# Patient Record
Sex: Male | Born: 1935 | Race: White | Hispanic: No | Marital: Single | Smoking: Never smoker
Health system: Southern US, Community
[De-identification: ages and names within clinical notes are randomized; demographics above are authoritative.]

## PROBLEM LIST (undated history)

## (undated) DIAGNOSIS — E7800 Pure hypercholesterolemia, unspecified: Secondary | ICD-10-CM

## (undated) DIAGNOSIS — F0390 Unspecified dementia without behavioral disturbance: Secondary | ICD-10-CM

## (undated) DIAGNOSIS — K219 Gastro-esophageal reflux disease without esophagitis: Secondary | ICD-10-CM

## (undated) DIAGNOSIS — I10 Essential (primary) hypertension: Secondary | ICD-10-CM

---

## 1997-12-27 ENCOUNTER — Other Ambulatory Visit: Admission: RE | Admit: 1997-12-27 | Discharge: 1997-12-27 | Payer: Self-pay | Admitting: Internal Medicine

## 1998-01-04 ENCOUNTER — Encounter: Payer: Self-pay | Admitting: Internal Medicine

## 1998-01-04 ENCOUNTER — Ambulatory Visit (HOSPITAL_COMMUNITY): Admission: RE | Admit: 1998-01-04 | Discharge: 1998-01-04 | Payer: Self-pay | Admitting: Internal Medicine

## 2004-02-15 ENCOUNTER — Ambulatory Visit: Payer: Self-pay | Admitting: Internal Medicine

## 2004-02-29 ENCOUNTER — Ambulatory Visit: Payer: Self-pay | Admitting: Internal Medicine

## 2009-02-22 ENCOUNTER — Telehealth (INDEPENDENT_AMBULATORY_CARE_PROVIDER_SITE_OTHER): Payer: Self-pay | Admitting: *Deleted

## 2009-03-08 ENCOUNTER — Encounter (INDEPENDENT_AMBULATORY_CARE_PROVIDER_SITE_OTHER): Payer: Self-pay | Admitting: *Deleted

## 2009-03-15 ENCOUNTER — Telehealth: Payer: Self-pay | Admitting: Internal Medicine

## 2017-07-11 ENCOUNTER — Other Ambulatory Visit: Payer: Self-pay

## 2017-07-11 ENCOUNTER — Encounter (HOSPITAL_BASED_OUTPATIENT_CLINIC_OR_DEPARTMENT_OTHER): Payer: Self-pay | Admitting: Emergency Medicine

## 2017-07-11 ENCOUNTER — Emergency Department (HOSPITAL_BASED_OUTPATIENT_CLINIC_OR_DEPARTMENT_OTHER): Payer: Medicare Other

## 2017-07-11 ENCOUNTER — Emergency Department (HOSPITAL_BASED_OUTPATIENT_CLINIC_OR_DEPARTMENT_OTHER)
Admission: EM | Admit: 2017-07-11 | Discharge: 2017-07-11 | Disposition: A | Discharge status: Home / Self Care | Payer: Medicare Other | Attending: Emergency Medicine | Admitting: Emergency Medicine

## 2017-07-11 DIAGNOSIS — Z7982 Long term (current) use of aspirin: Secondary | ICD-10-CM | POA: Diagnosis not present

## 2017-07-11 DIAGNOSIS — K5792 Diverticulitis of intestine, part unspecified, without perforation or abscess without bleeding: Secondary | ICD-10-CM

## 2017-07-11 DIAGNOSIS — Z79899 Other long term (current) drug therapy: Secondary | ICD-10-CM | POA: Diagnosis not present

## 2017-07-11 DIAGNOSIS — Y93E1 Activity, personal bathing and showering: Secondary | ICD-10-CM | POA: Diagnosis not present

## 2017-07-11 DIAGNOSIS — S299XXA Unspecified injury of thorax, initial encounter: Secondary | ICD-10-CM | POA: Diagnosis present

## 2017-07-11 DIAGNOSIS — F0390 Unspecified dementia without behavioral disturbance: Secondary | ICD-10-CM | POA: Insufficient documentation

## 2017-07-11 DIAGNOSIS — Y999 Unspecified external cause status: Secondary | ICD-10-CM | POA: Insufficient documentation

## 2017-07-11 DIAGNOSIS — I10 Essential (primary) hypertension: Secondary | ICD-10-CM | POA: Insufficient documentation

## 2017-07-11 DIAGNOSIS — S2232XA Fracture of one rib, left side, initial encounter for closed fracture: Secondary | ICD-10-CM | POA: Diagnosis not present

## 2017-07-11 DIAGNOSIS — W010XXA Fall on same level from slipping, tripping and stumbling without subsequent striking against object, initial encounter: Secondary | ICD-10-CM | POA: Diagnosis not present

## 2017-07-11 DIAGNOSIS — R778 Other specified abnormalities of plasma proteins: Secondary | ICD-10-CM

## 2017-07-11 DIAGNOSIS — Y92121 Bathroom in nursing home as the place of occurrence of the external cause: Secondary | ICD-10-CM | POA: Diagnosis not present

## 2017-07-11 DIAGNOSIS — R7989 Other specified abnormal findings of blood chemistry: Secondary | ICD-10-CM

## 2017-07-11 HISTORY — DX: Essential (primary) hypertension: I10

## 2017-07-11 HISTORY — DX: Gastro-esophageal reflux disease without esophagitis: K21.9

## 2017-07-11 HISTORY — DX: Pure hypercholesterolemia, unspecified: E78.00

## 2017-07-11 HISTORY — DX: Unspecified dementia, unspecified severity, without behavioral disturbance, psychotic disturbance, mood disturbance, and anxiety: F03.90

## 2017-07-11 LAB — COMPREHENSIVE METABOLIC PANEL
ALT: 16 U/L — AB (ref 17–63)
AST: 19 U/L (ref 15–41)
Albumin: 3.6 g/dL (ref 3.5–5.0)
Alkaline Phosphatase: 56 U/L (ref 38–126)
Anion gap: 9 (ref 5–15)
BUN: 12 mg/dL (ref 6–20)
CHLORIDE: 99 mmol/L — AB (ref 101–111)
CO2: 24 mmol/L (ref 22–32)
Calcium: 9.7 mg/dL (ref 8.9–10.3)
Creatinine, Ser: 0.96 mg/dL (ref 0.61–1.24)
Glucose, Bld: 136 mg/dL — ABNORMAL HIGH (ref 65–99)
POTASSIUM: 4.3 mmol/L (ref 3.5–5.1)
SODIUM: 132 mmol/L — AB (ref 135–145)
Total Bilirubin: 1 mg/dL (ref 0.3–1.2)
Total Protein: 7 g/dL (ref 6.5–8.1)

## 2017-07-11 LAB — CBC WITH DIFFERENTIAL/PLATELET
Basophils Absolute: 0 10*3/uL (ref 0.0–0.1)
Basophils Relative: 1 %
EOS PCT: 1 %
Eosinophils Absolute: 0.1 10*3/uL (ref 0.0–0.7)
HCT: 38.9 % — ABNORMAL LOW (ref 39.0–52.0)
Hemoglobin: 13.3 g/dL (ref 13.0–17.0)
LYMPHS ABS: 1 10*3/uL (ref 0.7–4.0)
LYMPHS PCT: 12 %
MCH: 31.8 pg (ref 26.0–34.0)
MCHC: 34.2 g/dL (ref 30.0–36.0)
MCV: 93.1 fL (ref 78.0–100.0)
Monocytes Absolute: 0.8 10*3/uL (ref 0.1–1.0)
Monocytes Relative: 9 %
Neutro Abs: 6.8 10*3/uL (ref 1.7–7.7)
Neutrophils Relative %: 77 %
PLATELETS: 224 10*3/uL (ref 150–400)
RBC: 4.18 MIL/uL — AB (ref 4.22–5.81)
RDW: 12.6 % (ref 11.5–15.5)
WBC: 8.8 10*3/uL (ref 4.0–10.5)

## 2017-07-11 LAB — URINALYSIS, ROUTINE W REFLEX MICROSCOPIC
Bilirubin Urine: NEGATIVE
GLUCOSE, UA: NEGATIVE mg/dL
Hgb urine dipstick: NEGATIVE
KETONES UR: 15 mg/dL — AB
LEUKOCYTES UA: NEGATIVE
Nitrite: NEGATIVE
Protein, ur: NEGATIVE mg/dL
Specific Gravity, Urine: <1.005 — ABNORMAL LOW (ref 1.005–1.030)
pH: 7 (ref 5.0–8.0)

## 2017-07-11 LAB — TROPONIN I
TROPONIN I: 0.05 ng/mL — AB (ref <0.03)
TROPONIN I: 0.05 ng/mL — AB (ref <0.03)

## 2017-07-11 MED ORDER — HYDRALAZINE HCL 20 MG/ML IJ SOLN
5.0000 mg | Freq: Once | INTRAMUSCULAR | Status: AC
  Administered 2017-07-11: 5 mg via INTRAVENOUS
  Filled 2017-07-11: qty 1

## 2017-07-11 MED ORDER — AMOXICILLIN-POT CLAVULANATE 875-125 MG PO TABS: 1.0000 | ORAL_TABLET | Freq: Two times a day (BID) | ORAL | 0 refills | Status: AC

## 2017-07-11 MED ORDER — IOPAMIDOL (ISOVUE-300) INJECTION 61%: 100.0000 mL | Freq: Once | INTRAVENOUS | Status: DC | PRN

## 2017-07-11 MED ORDER — IOPAMIDOL (ISOVUE-370) INJECTION 76%
100.0000 mL | Freq: Once | INTRAVENOUS | Status: AC | PRN
  Administered 2017-07-11: 100 mL via INTRAVENOUS

## 2017-07-11 MED ORDER — TRAMADOL HCL 50 MG PO TABS: 50.0000 mg | ORAL_TABLET | Freq: Four times a day (QID) | ORAL | 0 refills | Status: AC | PRN

## 2017-07-11 MED ORDER — ACETAMINOPHEN 325 MG PO TABS
650.0000 mg | ORAL_TABLET | Freq: Once | ORAL | Status: AC
  Administered 2017-07-11: 650 mg via ORAL
  Filled 2017-07-11: qty 2

## 2017-07-11 NOTE — ED Notes (Signed)
Patient transported to CT 

## 2017-07-11 NOTE — ED Notes (Signed)
Edema noted to L elbow, MD and RN made aware.

## 2017-07-11 NOTE — ED Provider Notes (Signed)
[facility] EMERGENCY DEPARTMENT Provider Note   CSN: [id number] Arrival date & time: 07/11/17  1252     History   Chief Complaint Chief Complaint  Patient presents with  . Fall    HPI James Mcfarland is a 84 y.o. male hx of dementia, GERD, HTN, here with fall. Patient is from nursing home. He was in the shower and then fell and landed on his left side. Complains of L rib pain. Patient is demented and didn't remember how he fell but denies chest pain or dizziness prior to the fall. Patient unclear if he hit his head or not. Patient is not on blood thinners. EMS called and brought him for evaluation.   The history is provided by the patient and a relative.   Level V caveat- dementia   Past Medical History:  Diagnosis Date  . Dementia   . GERD (gastroesophageal reflux disease)   . Hypercholesteremia   . Hypertension     There are no active problems to display for this patient.   History reviewed. No pertinent surgical history.      Home Medications    Prior to Admission medications   Medication Sig Start Date End Date Taking? Authorizing Provider  aspirin EC 81 MG tablet Take 81 mg by mouth daily.   Yes [provider]  donepezil (ARICEPT) 10 MG tablet Take 10 mg by mouth at bedtime.   Yes [provider]  furosemide (LASIX) 20 MG tablet Take 20 mg by mouth.   Yes [provider]  losartan (COZAAR) 25 MG tablet Take 25 mg by mouth daily.   Yes [provider]  lovastatin (MEVACOR) 40 MG tablet Take 40 mg by mouth at bedtime.   Yes [provider]  metoprolol succinate (TOPROL-XL) 50 MG 24 hr tablet Take 50 mg by mouth daily. Take with or immediately following a meal.   Yes [provider]  nitroGLYCERIN (NITROSTAT) 0.4 MG SL tablet Place 0.4 mg under the tongue every 5 (five) minutes as needed for chest pain.   Yes [provider]  omeprazole (PRILOSEC) 40 MG capsule Take 40 mg by mouth daily.    Yes [provider]  risperiDONE (RISPERDAL) 0.5 MG tablet Take 0.5 mg by mouth at bedtime.   Yes [provider]  sertraline (ZOLOFT) 25 MG tablet Take 25 mg by mouth daily.   Yes [provider]  tamsulosin (FLOMAX) 0.4 MG CAPS capsule Take 0.4 mg by mouth.   Yes [provider]    Family History History reviewed. No pertinent family history.  Social History Social History   Tobacco Use  . Smoking status: Never Smoker  . Smokeless tobacco: Never Used  Substance Use Topics  . Alcohol use: Never    Frequency: Never  . Drug use: Never     Allergies   Septra [sulfamethoxazole-trimethoprim]   Review of Systems Review of Systems  Musculoskeletal:       L rib pain   All other systems reviewed and are negative.    Physical Exam Updated Vital Signs BP (!) 168/78   Pulse 62   Temp 98 F (36.7 C) (Oral)   Resp 13   Ht  (1.778 m)   Wt 77.1 kg (170 lb)   SpO2 100%   BMI 24.39 kg/m   Physical Exam  Constitutional:  Chronically ill, demented   HENT:  Head: Normocephalic.  No obvious scalp hematoma   Eyes: Pupils are equal, round, and  reactive to light. Conjunctivae and EOM are normal.  Neck: Normal range of motion. Neck supple.  Cardiovascular: Normal rate, regular rhythm and normal heart sounds.  Pulmonary/Chest: Effort normal and breath sounds normal.  Tenderness L lower ribs, mild bruising, no obvious ecchymosis   Abdominal: Soft. Bowel sounds are normal. He exhibits no distension. There is no tenderness. There is no guarding.  Musculoskeletal: Normal range of motion.  No midline tenderness, mild swelling l elbow, nl ROM. Nl ROM bilateral hips   Neurological: He is alert. No cranial nerve deficit.  Demented, moving all extremities   Skin: Skin is warm.  Psychiatric: He has a normal mood and affect.  Nursing note and vitals reviewed.    ED Treatments / Results  Labs (all labs ordered are listed, but only abnormal  results are displayed) Labs Reviewed  CBC WITH DIFFERENTIAL/PLATELET - Abnormal; Notable for the following components:      Result Value   RBC 4.18 (*)    HCT 38.9 (*)    All other components within normal limits  COMPREHENSIVE METABOLIC PANEL - Abnormal; Notable for the following components:   Sodium 132 (*)    Chloride 99 (*)    Glucose, Bld 136 (*)    ALT 16 (*)    All other components within normal limits  TROPONIN I - Abnormal; Notable for the following components:   Troponin I 0.05 (*)    All other components within normal limits  TROPONIN I    EKG EKG Interpretation  Date/Time:  Saturday July 11 2017 14:02:49 EDT Ventricular Rate:  52 PR Interval:    QRS Duration: 96 QT Interval:  472 QTC Calculation: 439 R Axis:   92 Text Interpretation:  Sinus rhythm Right axis deviation No previous ECGs available Confirmed by Richardean Canal ([id number]) on 07/11/2017 2:14:27 PM   Radiology Dg Ribs Unilateral W/chest Left  Result Date: 07/11/2017 CLINICAL DATA:  Fall in shower this morning. Left rib pain. Initial encounter. EXAM: LEFT RIBS AND CHEST - 3+ VIEW COMPARISON:  09/06/2015 FINDINGS: Mildly displaced posterior left eighth rib fracture. Neighboring hazy density on the chest x-rays attributed to soft tissue attenuation given the dedicated rib views. There is no visible hemothorax or pneumothorax. Normal heart size. Aortic tortuosity. Degenerative disc disease. IMPRESSION: 1. Mildly displaced posterior left eighth rib fracture. 2. Negative for pneumothorax or hemothorax. Electronically Signed   By: Marnee Spring M.D.   On: 07/11/2017 14:15   Dg Elbow Complete Left  Result Date: 07/11/2017 CLINICAL DATA:  Fall in shower with swelling at the posterior left elbow. Initial encounter. EXAM: LEFT ELBOW - COMPLETE 3+ VIEW COMPARISON:  None. FINDINGS: Swelling at the olecranon bursa. No fracture or joint effusion. Small calcifications along the ventral lower humerus are smooth and chronic  appearing. Osteopenia. Medial epicondylar enthesophytes. Chondrocalcinosis about the radial head. IMPRESSION: 1. Soft tissue swelling at the olecranon process, possibly in the bursa. 2. No acute osseous finding. Electronically Signed   By: Marnee Spring M.D.   On: 07/11/2017 15:17   Ct Head Wo Contrast  Result Date: 07/11/2017 CLINICAL DATA:  Status post fall.  Pain. EXAM: CT HEAD WITHOUT CONTRAST CT CERVICAL SPINE WITHOUT CONTRAST TECHNIQUE: Multidetector CT imaging of the head and cervical spine was performed following the standard protocol without intravenous contrast. Multiplanar CT image reconstructions of the cervical spine were also generated. COMPARISON:  September 05, 2015 FINDINGS: CT HEAD FINDINGS Brain: No subdural, epidural, or subarachnoid hemorrhage. Cerebellum, brainstem, and basal cisterns are normal.  Ventricles and sulci are prominent but stable. No mass effect or midline shift. No acute cortical ischemia or infarct. Vascular: Calcified atherosclerosis in the intracranial carotids. Skull: Normal. Negative for fracture or focal lesion. Sinuses/Orbits: No acute finding. Other: None. CT CERVICAL SPINE FINDINGS Alignment: Scoliotic curvature, apex to the left. No other malalignment. No acute fractures. Skull base and vertebrae: No acute fracture. No primary bone lesion or focal pathologic process. Soft tissues and spinal canal: No prevertebral fluid or swelling. No visible canal hematoma. Disc levels: Severe multilevel degenerative changes, markedly increased since June 2017. Upper chest: Negative. Other: No other abnormalities identified. IMPRESSION: 1. No acute intracranial abnormalities identified. 2. Scoliotic curvature of the cervical spine. No traumatic malalignment. Severe multilevel degenerative changes, markedly worsened since 2017. No fractures are noted. Electronically Signed   By: Gerome Sam III M.D   On: 07/11/2017 14:27   Ct Cervical Spine Wo Contrast  Result Date:  07/11/2017 CLINICAL DATA:  Status post fall.  Pain. EXAM: CT HEAD WITHOUT CONTRAST CT CERVICAL SPINE WITHOUT CONTRAST TECHNIQUE: Multidetector CT imaging of the head and cervical spine was performed following the standard protocol without intravenous contrast. Multiplanar CT image reconstructions of the cervical spine were also generated. COMPARISON:  September 05, 2015 FINDINGS: CT HEAD FINDINGS Brain: No subdural, epidural, or subarachnoid hemorrhage. Cerebellum, brainstem, and basal cisterns are normal. Ventricles and sulci are prominent but stable. No mass effect or midline shift. No acute cortical ischemia or infarct. Vascular: Calcified atherosclerosis in the intracranial carotids. Skull: Normal. Negative for fracture or focal lesion. Sinuses/Orbits: No acute finding. Other: None. CT CERVICAL SPINE FINDINGS Alignment: Scoliotic curvature, apex to the left. No other malalignment. No acute fractures. Skull base and vertebrae: No acute fracture. No primary bone lesion or focal pathologic process. Soft tissues and spinal canal: No prevertebral fluid or swelling. No visible canal hematoma. Disc levels: Severe multilevel degenerative changes, markedly increased since June 2017. Upper chest: Negative. Other: No other abnormalities identified. IMPRESSION: 1. No acute intracranial abnormalities identified. 2. Scoliotic curvature of the cervical spine. No traumatic malalignment. Severe multilevel degenerative changes, markedly worsened since 2017. No fractures are noted. Electronically Signed   By: Gerome Sam III M.D   On: 07/11/2017 14:27    Procedures Procedures (including critical care time)  Medications Ordered in ED Medications  acetaminophen (TYLENOL) tablet 650 mg (650 mg Oral Given 07/11/17 1347)  hydrALAZINE (APRESOLINE) injection 5 mg (5 mg Intravenous Given 07/11/17 1539)     Initial Impression / Assessment and Plan / ED Course  I have reviewed the triage vital signs and the nursing  notes.  Pertinent labs & imaging results that were available during my care of the patient were reviewed by me and considered in my medical decision making (see chart for details).     James Mcfarland is a 84 y.o. male here with fall. Unclear if he syncopized or not as he is demented and can't tell me exactly how he fell in the shower. He is not on blood thinners. Has L rib pain so will get rib series. Will get CT head/neck given possible head injury. Will get labs and EKG as well.   3:57 PM Labs showed Trop 0.05, no baseline. No obvious AKI. No chest pain currently. Family just told me that he has recent diagnosis of thoracic aneurysm. I reviewed previous chart and he had incidental thoracic aneurysm about 4 cm but imaging is not available on Epic. He is hypertensive so will get CTA to r/o  dissection or rupture. I discussed with family in detail about plan of care. He is DNR. He doesn't want cath or aggressive measures. They are ok with CTA to r/o dissection and second troponin. They prefer that he not be admitted to the hospital if possible. Care signed out to Dr. Jacqulyn Bath in the ED to follow up CTA and delta trop.   Final Clinical Impressions(s) / ED Diagnoses   Final diagnoses:  None    ED Discharge Orders    None       Charlynne Pander, MD 07/11/17 1558

## 2017-07-11 NOTE — Discharge Instructions (Addendum)
Take tylenol for pain.   Take tramadol for severe. You have rib fracture and are expected to be in pain   Use incentive spirometer four times daily to expand your lungs and prevent pneumonia.   Your heart enzymes are slightly elevated. See cardiology for follow up   Your CT scan shows some Diverticulitis. We are starting antibiotics.   Follow up with your primary care doctor next week   Return to ER if you have worse chest pain, trouble breathing, abdominal pain, weakness

## 2017-07-11 NOTE — ED Provider Notes (Signed)
Blood pressure (!) 168/78, pulse 62, temperature 98 F (36.7 C), temperature source Oral, resp. rate 13, height  (1.778 m), weight 77.1 kg (170 lb), SpO2 100 %.  Assuming care from Dr. Silverio Lay.  In short, James Mcfarland is a 84 y.o. male with a chief complaint of Fall .  Refer to the original H&P for additional details.  The current plan of care is to f/u CT dissection scan and repeat troponin. Patient is DNR and pain is well controlled at this time.    EKG Interpretation  Date/Time:  Saturday July 11 2017 14:02:49 EDT Ventricular Rate:  52 PR Interval:    QRS Duration: 96 QT Interval:  472 QTC Calculation: 439 R Axis:   92 Text Interpretation:  Sinus rhythm Right axis deviation No previous ECGs available Confirmed by Richardean Canal [phone]) on 07/11/2017 2:14:27 PM      06:53 PM Patient's repeat troponin is unchanged.  CT scan reviewed with the patient and family at bedside.  No change in the thoracic aortic aneurysm.  Single rib fracture identified.  Also discussed the renal cyst and recommended PCP follow-up.  Patient to do incentive spirometry every hour while awake and take Tylenol as needed for pain.  Dr. Silverio Lay prescribed tramadol for breakthrough pain.  He can also describe some diverticulitis.  Patient does have a history of this and some pain on exam.  Plan to cover with Augmentin and follow-up with the PCP.   At this time, I do not feel there is any life-threatening condition present. I have reviewed and discussed all results (EKG, imaging, lab, urine as appropriate), exam findings with patient. I have reviewed nursing notes and appropriate previous records.  I feel the patient is safe to be discharged home without further emergent workup. Discussed usual and customary return precautions. Patient and family (if present) verbalize understanding and are comfortable with this plan.  Patient will follow-up with their primary care provider. If they do not have a primary care provider,  information for follow-up has been provided to them. All questions have been answered.  Alona Bene, MD Emergency Medicine     Long, Arlyss Repress, MD 07/11/17 [phone]

## 2017-07-11 NOTE — ED Triage Notes (Signed)
Per ems the patient fell earlier this am and hurt his left rib cage - the patient denies any LOC or hitting his head

## 2018-11-24 DIAGNOSIS — I2699 Other pulmonary embolism without acute cor pulmonale: Secondary | ICD-10-CM

## 2018-11-24 DIAGNOSIS — R739 Hyperglycemia, unspecified: Secondary | ICD-10-CM

## 2018-11-24 DIAGNOSIS — J690 Pneumonitis due to inhalation of food and vomit: Secondary | ICD-10-CM

## 2018-11-24 DIAGNOSIS — I10 Essential (primary) hypertension: Secondary | ICD-10-CM

## 2018-11-24 DIAGNOSIS — F0390 Unspecified dementia without behavioral disturbance: Secondary | ICD-10-CM

## 2018-11-25 DIAGNOSIS — R739 Hyperglycemia, unspecified: Secondary | ICD-10-CM | POA: Diagnosis not present

## 2018-11-25 DIAGNOSIS — J690 Pneumonitis due to inhalation of food and vomit: Secondary | ICD-10-CM | POA: Diagnosis not present

## 2018-11-25 DIAGNOSIS — I10 Essential (primary) hypertension: Secondary | ICD-10-CM | POA: Diagnosis not present

## 2018-11-25 DIAGNOSIS — I2699 Other pulmonary embolism without acute cor pulmonale: Secondary | ICD-10-CM | POA: Diagnosis not present

## 2018-11-26 DIAGNOSIS — J690 Pneumonitis due to inhalation of food and vomit: Secondary | ICD-10-CM | POA: Diagnosis not present

## 2018-11-26 DIAGNOSIS — I10 Essential (primary) hypertension: Secondary | ICD-10-CM | POA: Diagnosis not present

## 2018-11-26 DIAGNOSIS — I2699 Other pulmonary embolism without acute cor pulmonale: Secondary | ICD-10-CM | POA: Diagnosis not present

## 2018-11-26 DIAGNOSIS — R739 Hyperglycemia, unspecified: Secondary | ICD-10-CM | POA: Diagnosis not present

## 2019-01-16 DEATH — deceased

## 2019-09-14 IMAGING — DX DG ELBOW COMPLETE 3+V*L*
4 series · 4 of 4 positions shown · non-contrast
Comparison: None.

CLINICAL DATA: Fall in shower with swelling at the posterior left
elbow. Initial encounter.

EXAM:
LEFT ELBOW - COMPLETE 3+ VIEW

[elbow ap]
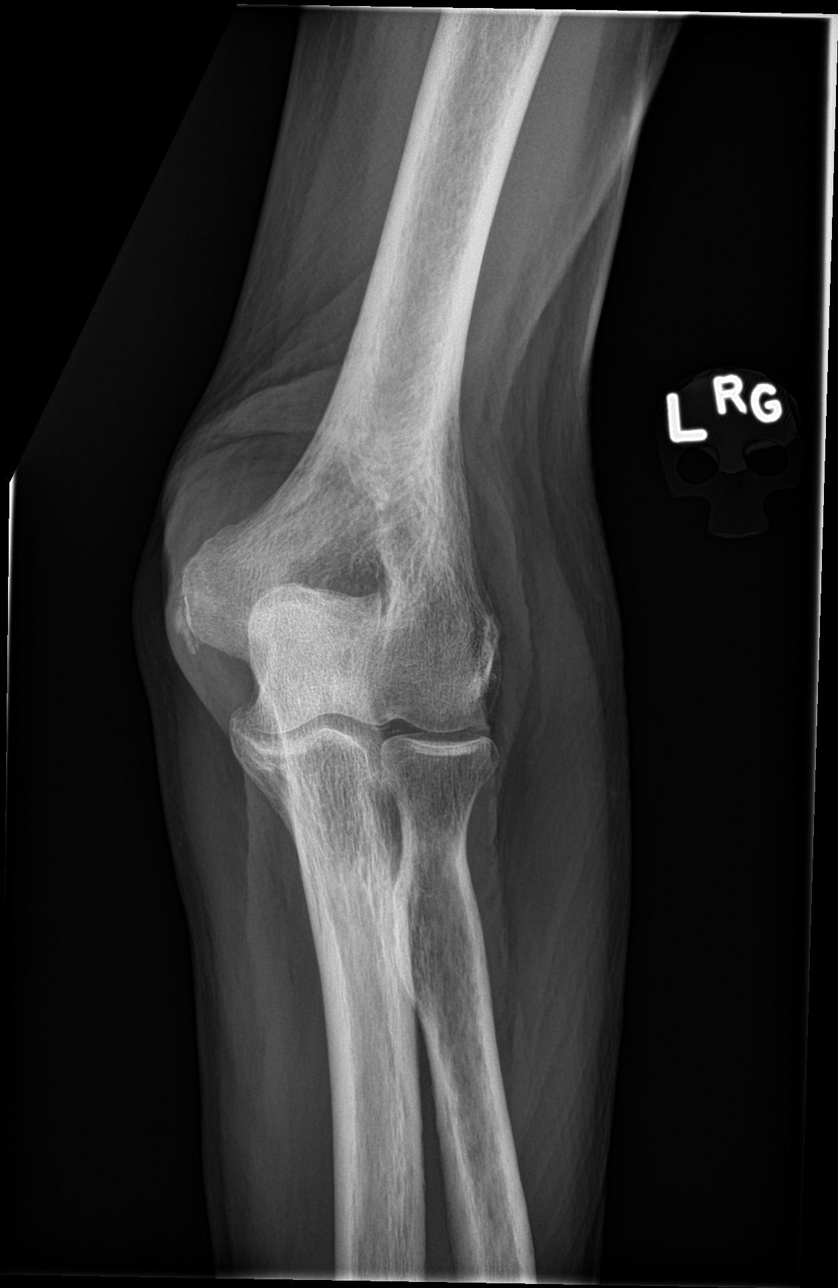

[elbow obl (1 of 2)]
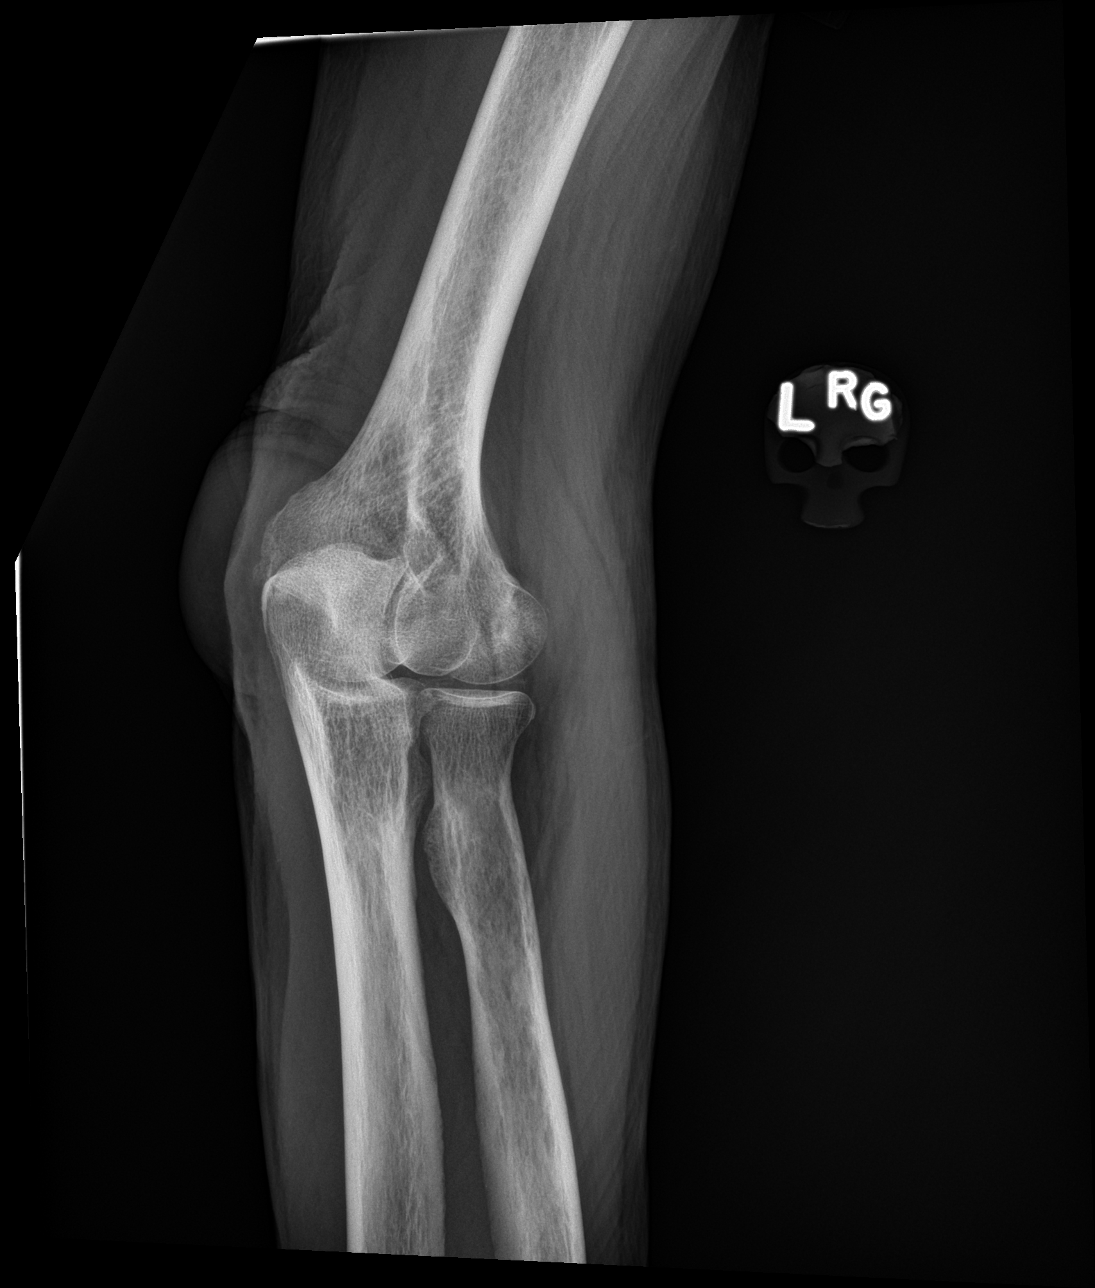

[elbow obl (2 of 2)]
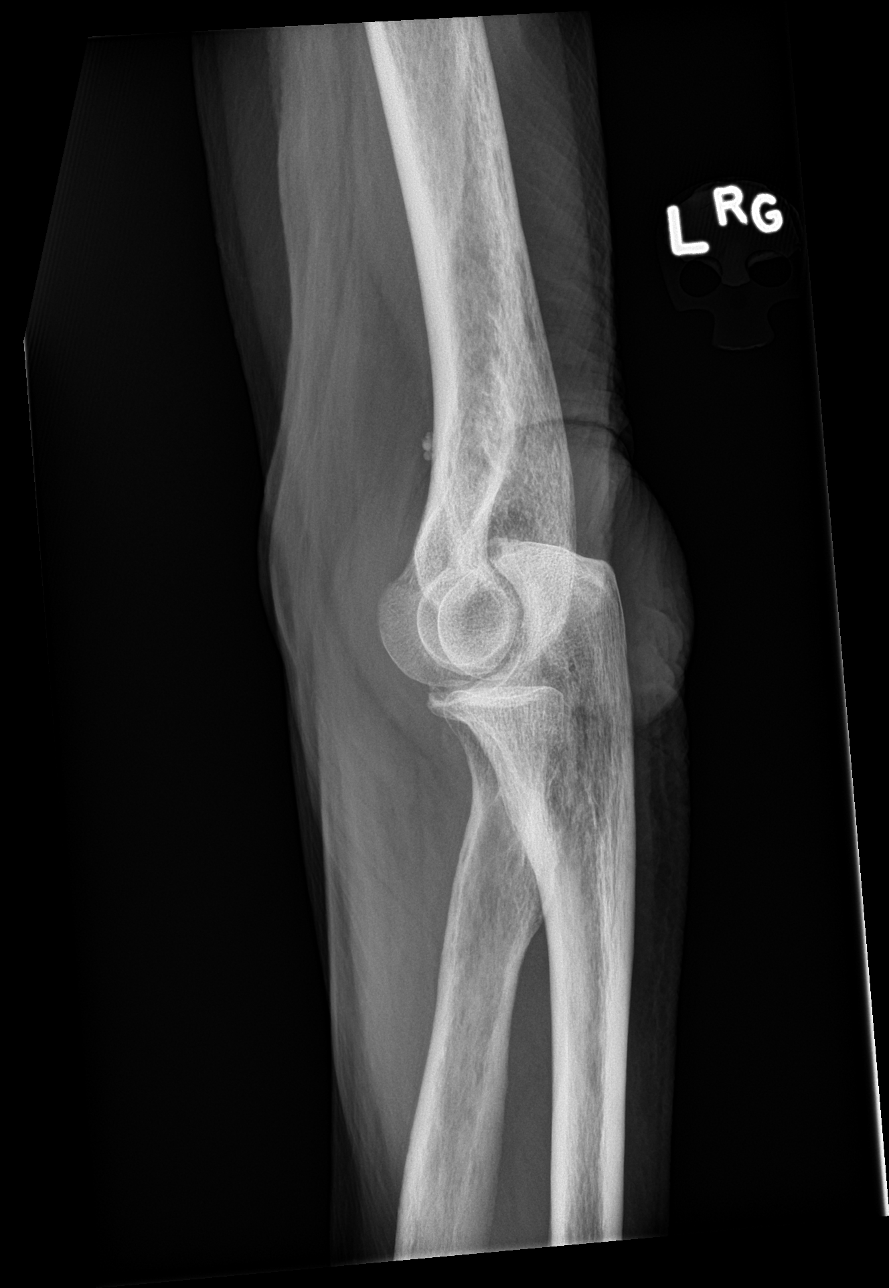

[elbow lat]
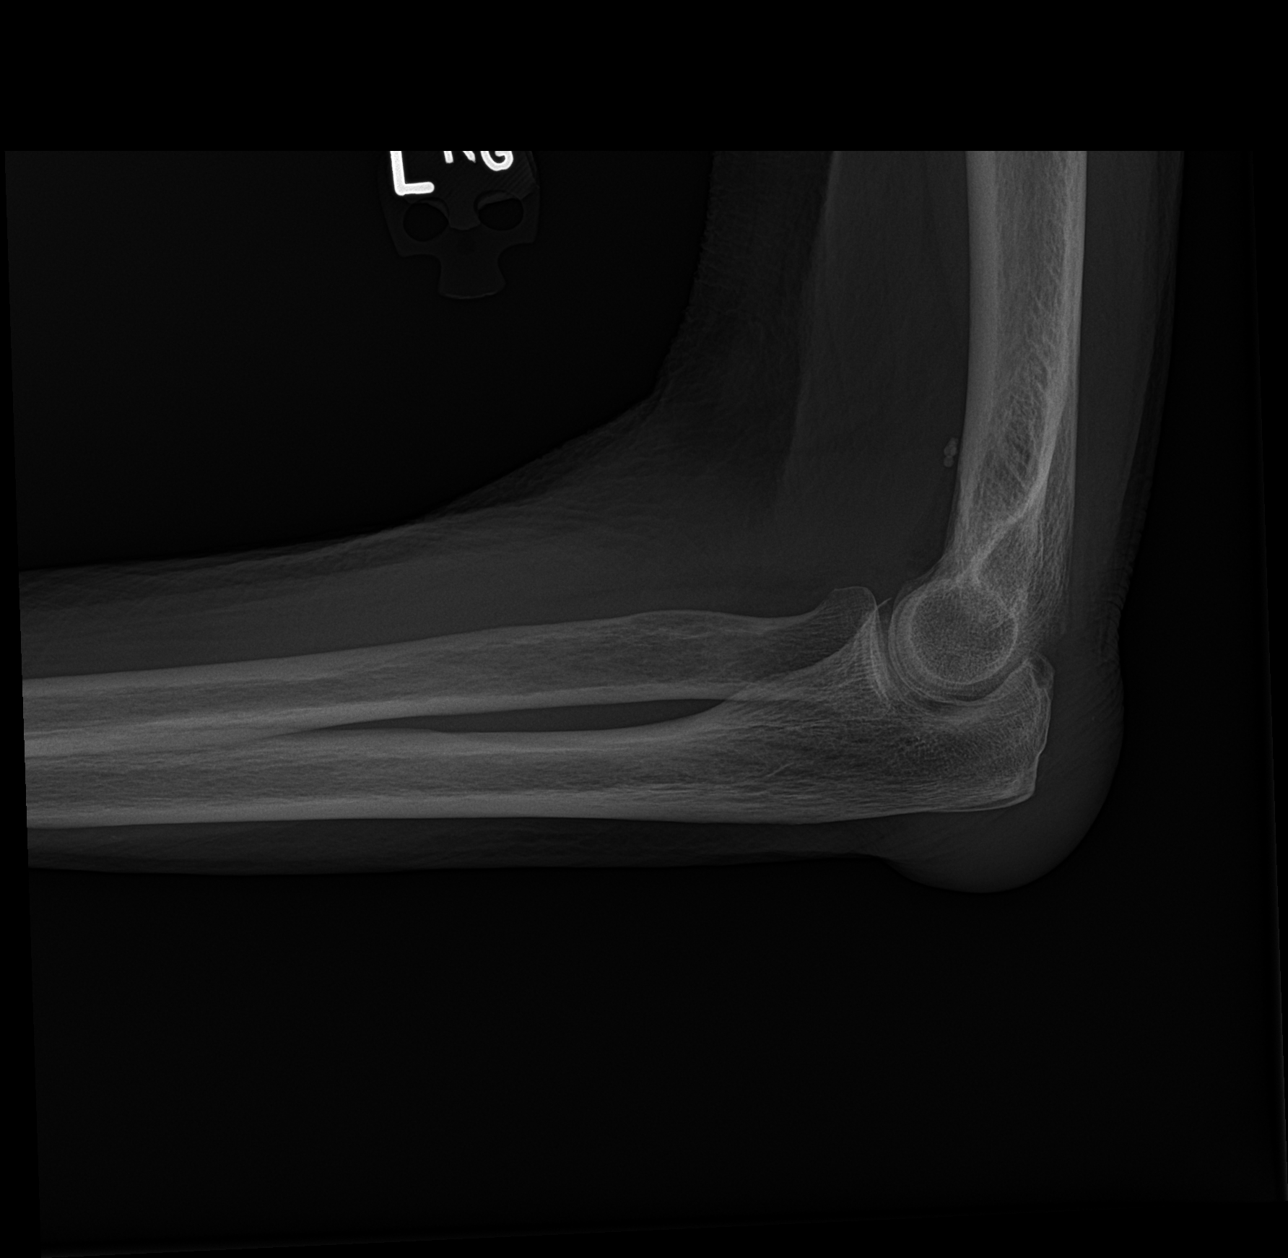

[4 of 4 positions shown; findings below may reference images not displayed]

FINDINGS: Swelling at the olecranon bursa. No fracture or joint effusion.
Small calcifications along the ventral lower humerus are smooth and
chronic appearing. Osteopenia. Medial epicondylar enthesophytes.
Chondrocalcinosis about the radial head.
IMPRESSION: 1. Soft tissue swelling at the olecranon process, possibly in the
bursa.
2. No acute osseous finding.

## 2020-01-16 DEATH — deceased

## 2021-01-15 DEATH — deceased
